# Patient Record
Sex: Female | Born: 2000 | Race: Black or African American | Hispanic: No | Marital: Single | Smoking: Never smoker
Health system: Southern US, Community
[De-identification: ages and names within clinical notes are randomized; demographics above are authoritative.]

## PROBLEM LIST (undated history)

## (undated) DIAGNOSIS — J302 Other seasonal allergic rhinitis: Secondary | ICD-10-CM

## (undated) DIAGNOSIS — J45909 Unspecified asthma, uncomplicated: Secondary | ICD-10-CM

## (undated) HISTORY — PX: WISDOM TOOTH EXTRACTION: SHX21

## (undated) HISTORY — PX: ESOPHAGOGASTRODUODENOSCOPY: SHX1529

## (undated) HISTORY — DX: Unspecified asthma, uncomplicated: J45.909

## (undated) HISTORY — DX: Other seasonal allergic rhinitis: J30.2

---
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
## 2018-02-21 ENCOUNTER — Encounter (INDEPENDENT_AMBULATORY_CARE_PROVIDER_SITE_OTHER): Payer: Self-pay

## 2018-02-21 ENCOUNTER — Ambulatory Visit (INDEPENDENT_AMBULATORY_CARE_PROVIDER_SITE_OTHER): Payer: BLUE CROSS/BLUE SHIELD | Admitting: Gastroenterology

## 2018-02-21 ENCOUNTER — Encounter

## 2018-02-21 ENCOUNTER — Encounter: Payer: Self-pay | Admitting: Gastroenterology

## 2018-02-21 VITALS — BP 100/60 | HR 76 | Ht 63.25 in | Wt 132.0 lb

## 2018-02-21 DIAGNOSIS — R110 Nausea: Secondary | ICD-10-CM

## 2018-02-21 NOTE — Patient Instructions (Signed)
If you are age 19 or older, your body mass index should be between 23-30. Your Body mass index is 23.2 kg/m. If this is out of the aforementioned range listed, please consider follow up with your Primary Care Provider.  If you are age 19 or younger, your body mass index should be between 19-25. Your Body mass index is 23.2 kg/m. If this is out of the aformentioned range listed, please consider follow up with your Primary Care Provider.   You have been scheduled for an endoscopy. Please follow written instructions given to you at your visit today. If you use inhalers (even only as needed), please bring them with you on the day of your procedure. Your physician has requested that you go to www.startemmi.com and enter the access code given to you at your visit today. This web site gives a general overview about your procedure. However, you should still follow specific instructions given to you by our office regarding your preparation for the procedure.  It was a pleasure to meet you today!  Dr. Myrtie Neitheranis

## 2018-02-21 NOTE — Progress Notes (Signed)
[facility] Consult Note:  History: Amanda Hamilton 02/21/2018  Referring physician: Self-referred  Reason for consult/chief complaint: Nausea (x 6 month, when eating and before and after having a BM, taking occasional Pepto)   Subjective  HPI:  This is a very pleasant 17 year old woman accompanied by her mother, self-referred for at least 6 months of frequent nausea.  It initially seemed to be occurring overnight and early morning, now can happen almost any time.  There may be days where she is well, but she feels nausea more days than not.  It might happen after eating or after bowel movement.  Zofran helps quite a lot and works quickly, but she is concerned that she needs to take it.  Her bowel habits are regular without rectal bleeding.  She denies vomiting, dysphagia, odynophagia or weight loss. There was a severe episode sometime in the last few months while in school in [place], and she went to emergency department.  She and her mother recall that labs and ultrasound were normal.  Her mother had given her a trial of her Zantac, which did not improve the nausea.   ROS:  Review of Systems  Constitutional: Negative for appetite change and unexpected weight change.  HENT: Negative for mouth sores and voice change.   Eyes: Negative for pain and redness.  Respiratory: Negative for cough and shortness of breath.   Cardiovascular: Negative for chest pain and palpitations.  Genitourinary: Negative for dysuria and hematuria.  Musculoskeletal: Negative for arthralgias and myalgias.  Skin: Negative for pallor and rash.  Neurological: Negative for weakness and headaches.  Hematological: Negative for adenopathy.     Past Medical History: Past Medical History:  Diagnosis Date  . Asthma      Past Surgical History: Past Surgical History:  Procedure Laterality Date  . WISDOM TOOTH EXTRACTION       Family History: Family History  Problem Relation Age of Onset    . Ulcerative colitis Mother   . Diabetes Mother   . Other Mother        H pylori, small bowel polyp  . Hypertension Father   . Alopecia Brother   . Cancer Maternal Grandmother        cholangiocarcinoma  . Diabetes Maternal Grandmother   . Breast cancer Maternal Aunt     Social History: Social History   Socioeconomic History  . Marital status: Single    Spouse name: Not on file  . Number of children: 0  . Years of education: Not on file  . Highest education level: Not on file  Occupational History  . Occupation: Consulting civil engineer  Social Needs  . Financial resource strain: Not on file  . Food insecurity:    Worry: Not on file    Inability: Not on file  . Transportation needs:    Medical: Not on file    Non-medical: Not on file  Tobacco Use  . Smoking status: Never Smoker  . Smokeless tobacco: Never Used  Substance and Sexual Activity  . Alcohol use: Yes    Comment: occasional  . Drug use: Never  . Sexual activity: Not on file  Lifestyle  . Physical activity:    Days per week: Not on file    Minutes per session: Not on file  . Stress: Not on file  Relationships  . Social connections:    Talks on phone: Not on file    Gets together: Not on file    Attends religious service: Not on file  Active member of club or organization: Not on file    Attends meetings of clubs or organizations: Not on file    Relationship status: Not on file  Other Topics Concern  . Not on file  Social History Narrative  . Not on file    Allergies: Allergies  Allergen Reactions  . Bactrim [Sulfamethoxazole-Trimethoprim] Nausea And Vomiting    Outpatient Meds: No current outpatient medications on file.   No current facility-administered medications for this visit.       ___________________________________________________________________ Objective   Exam:  BP 100/60 (BP Location: Left Arm, Patient Position: Sitting, Cuff Size: Normal)   Pulse 76   Ht 5' 3.25" (1.607 m) Comment:  height measured without shoes  Wt 132 lb (59.9 kg)   LMP 01/28/2018   BMI 23.20 kg/m    General: this is a(n) well-appearing patient  Eyes: sclera anicteric, no redness  ENT: oral mucosa moist without lesions, no cervical or supraclavicular lymphadenopathy, good dentition  CV: RRR without murmur, S1/S2, no JVD, no peripheral edema  Resp: clear to auscultation bilaterally, normal RR and effort noted  GI: soft, mild epigastric tenderness, with active bowel sounds. No guarding or palpable organomegaly noted.  Skin; warm and dry, no rash or jaundice noted  Neuro: awake, alert and oriented x 3. Normal gross motor function and fluent speech  Labs:  No data for review  Assessment: Encounter Diagnosis  Name Primary?  . Nausea without vomiting Yes    Nausea without associated symptoms, some triggers that are not always consistent.  She denies vomiting or weight loss.  Her mother was positive for H. pylori and recalls having similar symptoms. Emelda FearFarin does not take aspirin or NSAIDs regularly (only briefly during menstrual cycle), and is otherwise healthy. Possible H. pylori gastritis, less likely peptic ulcer, very unlikely neoplasia.  Possible gastric dysrhythmia.  It does not sound typical for biliary colic, and ultrasound was reportedly normal.  Plan:  Upper endoscopy.  She is agreeable after discussion of the procedure and risks.  Thank you for the courtesy of this consult.  Please call me with any questions or concerns.  Charlie PitterHenry L Danis III  CC: Patient, No Pcp Per

## 2018-02-22 ENCOUNTER — Other Ambulatory Visit: Payer: Self-pay

## 2018-02-22 ENCOUNTER — Ambulatory Visit (AMBULATORY_SURGERY_CENTER): Payer: BLUE CROSS/BLUE SHIELD | Admitting: Gastroenterology

## 2018-02-22 ENCOUNTER — Encounter: Payer: Self-pay | Admitting: Gastroenterology

## 2018-02-22 VITALS — BP 100/70 | HR 79 | Temp 98.9°F | Resp 22 | Ht 63.25 in | Wt 132.0 lb

## 2018-02-22 DIAGNOSIS — R110 Nausea: Secondary | ICD-10-CM | POA: Diagnosis not present

## 2018-02-22 DIAGNOSIS — K3189 Other diseases of stomach and duodenum: Secondary | ICD-10-CM | POA: Diagnosis not present

## 2018-02-22 MED ORDER — SODIUM CHLORIDE 0.9 % IV SOLN: 500.0000 mL | Freq: Once | INTRAVENOUS | Status: DC

## 2018-02-22 NOTE — Patient Instructions (Signed)
YOU HAD AN ENDOSCOPIC PROCEDURE TODAY AT THE [facility]:   Refer to the procedure report that was given to you for any specific questions about what was found during the examination.  If the procedure report does not answer your questions, please call your gastroenterologist to clarify.  If you requested that your care partner not be given the details of your procedure findings, then the procedure report has been included in a sealed envelope for you to review at your convenience later.  YOU SHOULD EXPECT: Some feelings of bloating in the abdomen. Passage of more gas than usual.  Walking can help get rid of the air that was put into your GI tract during the procedure and reduce the bloating.   Please Note:  You might notice some irritation and congestion in your nose or some drainage.  This is from the oxygen used during your procedure.  There is no need for concern and it should clear up in a day or so.  SYMPTOMS TO REPORT IMMEDIATELY:    Following upper endoscopy (EGD)  Vomiting of blood or coffee ground material  New chest pain or pain under the shoulder blades  Painful or persistently difficult swallowing  New shortness of breath  Fever of 100F or higher  Black, tarry-looking stools  For urgent or emergent issues, a gastroenterologist can be reached at any hour by calling [phone].   DIET:  We do recommend a small meal at first, but then you may proceed to your regular diet.  Drink plenty of fluids but you should avoid alcoholic beverages for 24 hours.  ACTIVITY:  You should plan to take it easy for the rest of today and you should NOT DRIVE or use heavy machinery until tomorrow (because of the sedation medicines used during the test).    FOLLOW UP: Our staff will call the number listed on your records the next business day following your procedure to check on you and address any questions or concerns that you may have regarding the information given to you  following your procedure. If we do not reach you, we will leave a message.  However, if you are feeling well and you are not experiencing any problems, there is no need to return our call.  We will assume that you have returned to your regular daily activities without incident.  If any biopsies were taken you will be contacted by phone or by letter within the next 1-3 weeks.  Please call us at [phone] if you have not heard about the biopsies in 3 weeks.    SIGNATURES/CONFIDENTIALITY: You and/or your care partner have signed paperwork which will be entered into your electronic medical record.  These signatures attest to the fact that that the information above on your After Visit Summary has been reviewed and is understood.  Full responsibility of the confidentiality of this discharge information lies with you and/or your care-partner.  Read all handouts given to you by  Your recovery room nurse.

## 2018-02-22 NOTE — Progress Notes (Signed)
Report given to PACU, vss 

## 2018-02-22 NOTE — Progress Notes (Signed)
Pt. Reports no change in her medical or surgical history since her pre-visit 02/21/2018. 

## 2018-02-22 NOTE — Op Note (Signed)
[facility] Patient Name: Amanda Hamilton Procedure Date: 02/22/2018 10:21 AM MRN: [id number] Endoscopist: Sherilyn Cooter L. Myrtie Neither , MD Age: 17 Referring MD:  Date of Birth: [birth year] Gender: Female Account #: [id number] Procedure:                Upper GI endoscopy Indications:              Nausea Medicines:                Monitored Anesthesia Care Procedure:                Pre-Anesthesia Assessment:                           - Prior to the procedure, a History and Physical                            was performed, and patient medications and                            allergies were reviewed. The patient's tolerance of                            previous anesthesia was also reviewed. The risks                            and benefits of the procedure and the sedation                            options and risks were discussed with the patient.                            All questions were answered, and informed consent                            was obtained. Prior Anticoagulants: The patient has                            taken no previous anticoagulant or antiplatelet                            agents. ASA Grade Assessment: I - A normal, healthy                            patient. After reviewing the risks and benefits,                            the patient was deemed in satisfactory condition to                            undergo the procedure.                           After obtaining informed consent, the endoscope was  passed under direct vision. Throughout the                            procedure, the patient's blood pressure, pulse, and                            oxygen saturations were monitored continuously. The                            Endoscope was introduced through the mouth, and                            advanced to the second part of duodenum. The upper                            GI endoscopy was accomplished without difficulty.                 The patient tolerated the procedure well. Scope In: Scope Out: Findings:                 The esophagus was normal.                           The entire examined stomach was normal. Biopsies                            were taken with a cold forceps for Helicobacter                            pylori testing using CLOtest.                           The cardia and gastric fundus were normal on                            retroflexion.                           The examined duodenum was normal. Biopsies for                            histology were taken with a cold forceps for                            evaluation of celiac disease. Complications:            No immediate complications. Estimated Blood Loss:     Estimated blood loss was minimal. Impression:               - Normal esophagus.                           - Normal stomach. Biopsied.                           - Normal examined duodenum. Biopsied. Recommendation:           -  Patient has a contact number available for                            emergencies. The signs and symptoms of potential                            delayed complications were discussed with the                            patient. Return to normal activities tomorrow.                            Written discharge instructions were provided to the                            patient.                           - Resume previous diet.                           - Continue present medications.                           - Await pathology results. Henry L. Myrtie Neitheranis, MD 02/22/2018 10:34:19 AM This report has been signed electronically.

## 2018-02-22 NOTE — Progress Notes (Signed)
Called to room to assist during endoscopic procedure.  Patient ID and intended procedure confirmed with present staff. Received instructions for my participation in the procedure from the performing physician.  

## 2018-02-23 ENCOUNTER — Telehealth: Payer: Self-pay

## 2018-02-23 LAB — HELICOBACTER PYLORI SCREEN-BIOPSY: UREASE: NEGATIVE

## 2018-02-23 NOTE — Telephone Encounter (Signed)
  Follow up Call-  Call back number 02/22/2018  Post procedure Call Back phone  # [phone]  Permission to leave phone message Yes     Patient questions:  Do you have a fever, pain , or abdominal swelling? No. Pain Score  0 *  Have you tolerated food without any problems? Yes.    Have you been able to return to your normal activities? Yes.    Do you have any questions about your discharge instructions: Diet   No. Medications  No. Follow up visit  No.  Do you have questions or concerns about your Care? No.  Actions: * If pain score is 4 or above: No action needed, pain <4.

## 2018-03-05 ENCOUNTER — Telehealth: Payer: Self-pay | Admitting: Gastroenterology

## 2018-03-09 DIAGNOSIS — H0014 Chalazion left upper eyelid: Secondary | ICD-10-CM | POA: Diagnosis not present

## 2018-03-19 DIAGNOSIS — H0014 Chalazion left upper eyelid: Secondary | ICD-10-CM | POA: Diagnosis not present

## 2018-03-22 ENCOUNTER — Telehealth: Payer: Self-pay | Admitting: Gastroenterology

## 2018-03-22 NOTE — Telephone Encounter (Signed)
Spoke with her mother after the EGD.  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   Suspect there may be some school-related anxiety aggravating it as well, which is common.  Since she has to return to school soon, let's try her on a different med for a couple weeks and see if any better than zofran to decrease the frequency of symptoms.  If not better, then she can take the zofran.  I can see her next school break with enough notice.  Rx:  Levbid 0.375 mg.  One tablet once daily.  Disp # 14, Rf 1

## 2018-03-22 NOTE — Telephone Encounter (Signed)
Spoke to patient, she is still having issues with nausea. She has noticed it happening either prior to or after a bowel movement. She states the zofran helps. She has tried adjusting her diet, which has not seemed to make a difference. She will be heading back to school soon.

## 2018-03-23 ENCOUNTER — Other Ambulatory Visit: Payer: Self-pay

## 2018-03-23 MED ORDER — HYOSCYAMINE SULFATE ER 0.375 MG PO TB12: 0.3750 mg | ORAL_TABLET | Freq: Every day | ORAL | 1 refills | Status: DC

## 2018-03-23 NOTE — Telephone Encounter (Signed)
Spoke to patient she will try the Levbid daily, she will contact our office to let us know if it is working or not. She returns to school on 03/26/18. When she knows her schedule advised her to contact office 6-8 weeks earlier than her break to get a follow up visit. Rx sent in. She understands that if it is not helping may continue the zofran.

## 2018-03-30 ENCOUNTER — Telehealth: Payer: Self-pay | Admitting: Gastroenterology

## 2018-03-30 NOTE — Telephone Encounter (Signed)
Tried to call patient back, unable to lvm. Wondering if she also tried the zofran. Will have to try back later.

## 2018-03-30 NOTE — Telephone Encounter (Signed)
Pt still dealing with nausea, med prescribed not working, needs advise on what else she can do. Ok to leave detailed message if she cannot answer.

## 2018-04-02 ENCOUNTER — Telehealth: Payer: Self-pay

## 2018-04-02 NOTE — Telephone Encounter (Signed)
Routed to DOD, patient of Dr. Myrtie Neitheranis, she states that hyoscyamine and zofran are not helping with the nausea. Episodes of nause are waking her up in the middle of the night and getting more frequent throughout the day. Please advise.

## 2018-04-02 NOTE — Telephone Encounter (Signed)
Patient advised to try the FDgard along with her other medications. She also wanted to let Dr. Myrtie Neitheranis know that occasionally when the nausea starts she will "feel shaky". Advised her to stay hydrated and may want to try some Gatorade along with the water she normally drinks. Patient is almost out of Zofran Rx and will need refill. Let her know that I would send this onto Dr. Myrtie Neitheranis and he can address that issue. She does have enough for a couple more days.

## 2018-04-02 NOTE — Telephone Encounter (Signed)
Chart reviewed, patient of Dr. Myrtie Neitheranis.  He is off today.  Ongoing nausea.  EGD neg.  US neg. 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  FDgard was previously recommended and I do not know if she tried this. If not, I recommend she try it. OTC and use per box instruction. Zofran 8 mg q 8 hours is max dose. Could try promethazine, but this is sedating and would likely interfere with school, driving, etc.  Promotility agent also considered, but often side-effects.  Likely would need GES before metoclopramide.  For today, would try FDgard, continue with current therapy and await further recs from Dr. Myrtie Neitheranis tomorrow.

## 2018-04-03 ENCOUNTER — Telehealth: Payer: Self-pay | Admitting: Gastroenterology

## 2018-04-03 ENCOUNTER — Other Ambulatory Visit: Payer: Self-pay

## 2018-04-03 DIAGNOSIS — R110 Nausea: Secondary | ICD-10-CM

## 2018-04-03 MED ORDER — ONDANSETRON HCL 8 MG PO TABS: 8.0000 mg | ORAL_TABLET | Freq: Three times a day (TID) | ORAL | 2 refills | Status: DC | PRN

## 2018-04-03 NOTE — Telephone Encounter (Signed)
One more thought.  Please also schedule her for a non-contrast head CT scan to rule out a central cause of nausea, especially because she now describes it often occurring in the morning and overnight.  - HD

## 2018-04-03 NOTE — Telephone Encounter (Signed)
zofran refilled Please schedule gastric emptying study. OV to follow up

## 2018-04-03 NOTE — Addendum Note (Signed)
Addended by: Charlie PitterANIS, HENRY L on: 04/03/2018 01:04 PM   Modules accepted: Orders

## 2018-04-03 NOTE — Telephone Encounter (Signed)
She is currently at school. I have sent her a message asking her to call us to let us know when she will have a break in order to schedule these tests/appointments.

## 2018-04-04 NOTE — Telephone Encounter (Signed)
Left message for patient's mother to give me a call and I will explain what tests Dr. Myrtie Neitheranis is recommending.

## 2018-04-05 ENCOUNTER — Telehealth: Payer: Self-pay | Admitting: Gastroenterology

## 2018-04-05 NOTE — Telephone Encounter (Signed)
Spoke to patient's mother, explained the tests that were ordered. Patient was able to see in MyChart the dates/times and she had to rescheduled them due to her classes. Mother is with daughter now and will be throughout the weekend and understands if need be will take her to the nearest ED. Explained that the other anti-nausea medication can be very sedating. The patient rescheduled her tests to 8/30, instructed to keep her follow up appointment with Dr. Myrtie Neitheranis.

## 2018-04-09 ENCOUNTER — Other Ambulatory Visit (HOSPITAL_COMMUNITY): Payer: BLUE CROSS/BLUE SHIELD

## 2018-04-09 ENCOUNTER — Ambulatory Visit (HOSPITAL_COMMUNITY): Payer: BLUE CROSS/BLUE SHIELD

## 2018-04-13 ENCOUNTER — Ambulatory Visit (HOSPITAL_COMMUNITY)
Admission: RE | Admit: 2018-04-13 | Discharge: 2018-04-13 | Disposition: A | Discharge status: Home / Self Care | Payer: BLUE CROSS/BLUE SHIELD | Source: Ambulatory Visit | Attending: Gastroenterology | Admitting: Gastroenterology

## 2018-04-13 DIAGNOSIS — R110 Nausea: Secondary | ICD-10-CM | POA: Insufficient documentation

## 2018-04-13 DIAGNOSIS — G9389 Other specified disorders of brain: Secondary | ICD-10-CM | POA: Diagnosis not present

## 2018-04-13 MED ORDER — TECHNETIUM TC 99M SULFUR COLLOID
2.1200 | Freq: Once | INTRAVENOUS | Status: AC | PRN
  Administered 2018-04-13: 2.12 via ORAL

## 2018-04-13 MED ORDER — TECHNETIUM TC 99M SULFUR COLLOID: 2.1200 | Freq: Once | INTRAVENOUS | Status: DC | PRN

## 2018-04-20 ENCOUNTER — Telehealth: Payer: Self-pay | Admitting: Gastroenterology

## 2018-04-20 NOTE — Telephone Encounter (Signed)
Spoke to patient she wanted to know if she needed to follow up, per Dr. Myrtie Neither' note he would like to see her in office to follow up and review tests/reassess.

## 2018-05-04 ENCOUNTER — Ambulatory Visit: Payer: BLUE CROSS/BLUE SHIELD | Admitting: Gastroenterology

## 2018-05-18 ENCOUNTER — Telehealth: Payer: Self-pay | Admitting: Gastroenterology

## 2018-05-18 MED ORDER — ONDANSETRON HCL 8 MG PO TABS: 8.0000 mg | ORAL_TABLET | Freq: Three times a day (TID) | ORAL | 2 refills | Status: DC | PRN

## 2018-05-18 NOTE — Telephone Encounter (Signed)
Refill request for Zofran 8mg  1 every 8 hrs as needed.  Last seen 7-10 2019

## 2018-05-18 NOTE — Telephone Encounter (Signed)
Patient requesting refill of medication zofran sent to [facility] in [place].

## 2018-06-11 ENCOUNTER — Encounter: Payer: Self-pay | Admitting: Gastroenterology

## 2018-06-11 ENCOUNTER — Ambulatory Visit: Payer: BLUE CROSS/BLUE SHIELD | Admitting: Gastroenterology

## 2018-06-11 VITALS — BP 98/64 | HR 70 | Ht 63.0 in | Wt 128.5 lb

## 2018-06-11 DIAGNOSIS — R110 Nausea: Secondary | ICD-10-CM

## 2018-06-11 DIAGNOSIS — K5901 Slow transit constipation: Secondary | ICD-10-CM

## 2018-06-11 DIAGNOSIS — R6881 Early satiety: Secondary | ICD-10-CM

## 2018-06-11 MED ORDER — DICYCLOMINE HCL 10 MG PO CAPS: 10.0000 mg | ORAL_CAPSULE | Freq: Three times a day (TID) | ORAL | 1 refills | Status: DC

## 2018-06-11 NOTE — Patient Instructions (Signed)
Stop taking FD guard  Decrease Zofran use to as needed for nausea, rather than every 8 hours  Start dicyclomine 10 mg twice a day before breakfast and supper.  If this causes worsened constipation or side effects such as blurred vision, dry mouth, fatigue, stop this medicine.  Start MiraLAX powder, one half capful in a glass of liquid every other day to relieve constipation.  Stop if stools become too loose or frequent.

## 2018-06-11 NOTE — Progress Notes (Signed)
     [facility] Progress Note  Chief Complaint: Nausea  Subjective  History:  Amanda Hamilton follows up for her nausea and constipation. Symptoms are much the same as before, with frequent nausea but no vomiting.  It previously seem to happen mostly in the morning, but now can be about any time.  She also tends toward constipation. Nausea typically responds to Zofran, but she has been uneasy about taking that regularly. Upper endoscopy with biopsies normal, subsequent normal gastric emptying study and CT scan of head  She is taking Zofran 8 mg every 8 hours, and this seems to have led to constipation, with a BM about twice a week, which is as often as she feels she needs to have one.  She has feelings of early satiety and nausea several times a week. She did not tolerate Levbid well, stopping it due to fatigue, dry mouth  ROS: Cardiovascular:  no chest pain Respiratory: no dyspnea  The patient's Past Medical, Family and Social History were reviewed and are on file in the EMR.  Objective:  Med list reviewed  Current Outpatient Medications:  .  NON FORMULARY, daily. FD Gard 2 tabs before and after meals, Disp: , Rfl:  .  ondansetron (ZOFRAN) 8 MG tablet, Take 1 tablet (8 mg total) by mouth every 8 (eight) hours as needed. for nausea, Disp: 30 tablet, Rfl: 2 .  dicyclomine (BENTYL) 10 MG capsule, Take 1 capsule (10 mg total) by mouth 3 (three) times daily before meals., Disp: 60 capsule, Rfl: 1   Vital signs in last 24 hrs: Vitals:   06/11/18 0832  BP: 98/64  Pulse: 70  Weight down 4 pounds since July visit  Physical Exam  Her mother is present for the entire encounter. She is well-appearing, good muscle mass, alert, conversational and pleasant  HEENT: sclera anicteric, oral mucosa moist without lesions  Neck: supple, no thyromegaly, JVD or lymphadenopathy  Cardiac: RRR without murmurs, S1S2 heard, no peripheral edema  Pulm: clear to auscultation bilaterally, normal RR  and effort noted  Abdomen: soft, mild epigastric tenderness, with active bowel sounds. No guarding or palpable hepatosplenomegaly.  Skin; warm and dry, no jaundice or rash  Data:  Testing results as noted above   @ASSESSMENTPLANBEGIN @ Assessment: Encounter Diagnoses  Name Primary?  . Nausea without vomiting Yes  . Early satiety   . 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  Her constipation seems largely related to side effect of Zofran.  Plan: Trial of dicyclomine 10 mg 3 times daily hopefully better tolerated and Levbid. Stop FD gard, as it does not seem to been helping much. Decrease frequency of Zofran to just as needed with nausea which hopefully will help with the constipation side effect.  MiraLAX one half capful in a glass of liquid every other day to relieve constipation.  Stop if stools become too loose or frequent.  If this plan is not working out, only other treatment that I think might be helpful would be a trial of low-dose nocturnal buspirone, which has been studied and nonulcer dyspepsia.  We had an initial discussion about this, just that she knows the type of medicine and its indications.  Total time 25 minutes, over half spent face-to-face with patient in counseling and coordination of care.   Charlie Pitter III

## 2018-07-06 ENCOUNTER — Telehealth: Payer: Self-pay | Admitting: Gastroenterology

## 2018-07-06 NOTE — Telephone Encounter (Signed)
Left message I was returning her call. 

## 2018-07-06 NOTE — Telephone Encounter (Signed)
Patient has been taking Bentyl 2-3 times daily it does not seem to be helping, she is also having to take Zofran prn as she continues to have nausea. Please advise.

## 2018-07-09 NOTE — Telephone Encounter (Signed)
Attempted to call pt but receive message that mailbox is full and cannot accept messages. Will try again later.

## 2018-07-09 NOTE — Telephone Encounter (Signed)
Stop taking the dicyclomine.  We discussed at the recent visit that if this did not work out well, the last medicine that I know of which might be helpful is buspirone.  Buspirone 15 mg tablet Start with one half tablet at bedtime for a week.  If tolerated, and if no improvement in symptoms, increase to a whole tablet. Dispense 25 tablets, one refill.  Can lead to daytime fatigue and vivid dreams.

## 2018-07-10 NOTE — Telephone Encounter (Signed)
Left message for pt to call back on home number.

## 2018-07-16 ENCOUNTER — Telehealth: Payer: Self-pay | Admitting: Gastroenterology

## 2018-07-16 MED ORDER — BUSPIRONE HCL 15 MG PO TABS: ORAL_TABLET | ORAL | 1 refills | Status: DC

## 2018-07-16 NOTE — Telephone Encounter (Signed)
See phone note from 07/06/18.  Patient and mother notified of new recommendations from Dr. Myrtie Neitheranis.  Rx sent to [facility] in [place], she is in school there.

## 2018-07-19 ENCOUNTER — Telehealth: Payer: Self-pay | Admitting: Gastroenterology

## 2018-07-20 NOTE — Telephone Encounter (Signed)
Noted  

## 2018-07-20 NOTE — Telephone Encounter (Signed)
Pt aware.

## 2018-07-20 NOTE — Telephone Encounter (Signed)
Thanks for letting me know.  It will have to be zofran for her, since we discussed how the buspirone was the last of the meds I had to offer for this.

## 2018-07-20 NOTE — Telephone Encounter (Signed)
Pt called and wanted to let Dr. Myrtie Neitheranis know she took 1/2 tab of the Buspar and she felt nauseated and felt like her heart was racing. She did not like the way it may her feel and had not taken it anymore. Dr. Myrtie Neitheranis aware.

## 2018-07-21 ENCOUNTER — Other Ambulatory Visit: Payer: Self-pay | Admitting: Gastroenterology

## 2018-07-27 DIAGNOSIS — J3081 Allergic rhinitis due to animal (cat) (dog) hair and dander: Secondary | ICD-10-CM | POA: Diagnosis not present

## 2018-07-27 DIAGNOSIS — J3089 Other allergic rhinitis: Secondary | ICD-10-CM | POA: Diagnosis not present

## 2018-07-27 DIAGNOSIS — J301 Allergic rhinitis due to pollen: Secondary | ICD-10-CM | POA: Diagnosis not present

## 2018-07-27 DIAGNOSIS — T781XXD Other adverse food reactions, not elsewhere classified, subsequent encounter: Secondary | ICD-10-CM | POA: Diagnosis not present

## 2018-08-01 ENCOUNTER — Telehealth: Payer: Self-pay | Admitting: Gastroenterology

## 2018-08-01 DIAGNOSIS — Z6822 Body mass index (BMI) 22.0-22.9, adult: Secondary | ICD-10-CM | POA: Diagnosis not present

## 2018-08-01 DIAGNOSIS — Z01419 Encounter for gynecological examination (general) (routine) without abnormal findings: Secondary | ICD-10-CM | POA: Diagnosis not present

## 2018-08-01 NOTE — Telephone Encounter (Signed)
Pt called back in about the medication problem today she stated she would like to talk with a nurse.

## 2018-08-01 NOTE — Telephone Encounter (Signed)
Ms. Amanda Hamilton did not like the Buspar, due to the side effects she experienced. Today she saw her OBGYN provider Mitchel HonourMegan Morris at [facility] [place]. She recommended fluoxetine in the replacement of Buspar. Patient just wanted to see what your thought were on this medication and if you think it could help. Please advise.

## 2018-08-02 NOTE — Telephone Encounter (Signed)
Although buspar is a medication used for anxiety, that is not how I was using it here.  It also appears to modulate pain receptors in the intestine.  Fluoxetine is a medicine for depression and anxiety that works by a different mechanism.  If her gynecologist thinks mood issues are present and would like to prescribe fluoxetine, then Amanda Hamilton should consider trying it.  It might help the nausea, but I can't say I have any experience using fluoxetine for that.

## 2018-08-03 NOTE — Telephone Encounter (Signed)
I understand that they are frustrated since extensive testing and treatment have not been successful.  While they are welcome to come to a clinic visit here, I truly do not think we have anything further to offer her.  I recommend consultation at an academic medical center.  If they are agreeable, I would advise a referral to [facility].  All records/notes/results should accompany such a referral..

## 2018-08-03 NOTE — Telephone Encounter (Signed)
Spoke to the patient and her mom on speaker phone. They state understanding. Due to not tolerating the Buspar. A follow up with PA Zehr on 08-14-2018 tt

## 2018-08-07 ENCOUNTER — Other Ambulatory Visit: Payer: Self-pay | Admitting: Gastroenterology

## 2018-08-14 ENCOUNTER — Ambulatory Visit: Payer: BLUE CROSS/BLUE SHIELD | Admitting: Gastroenterology

## 2018-08-14 ENCOUNTER — Encounter: Payer: Self-pay | Admitting: Gastroenterology

## 2018-08-14 ENCOUNTER — Other Ambulatory Visit (INDEPENDENT_AMBULATORY_CARE_PROVIDER_SITE_OTHER): Payer: BLUE CROSS/BLUE SHIELD

## 2018-08-14 VITALS — BP 100/70 | HR 72 | Ht 63.0 in | Wt 130.0 lb

## 2018-08-14 DIAGNOSIS — R110 Nausea: Secondary | ICD-10-CM

## 2018-08-14 LAB — TSH: TSH: 0.59 u[IU]/mL (ref 0.40–5.00)

## 2018-08-14 MED ORDER — FAMOTIDINE 40 MG PO TABS: 40.0000 mg | ORAL_TABLET | Freq: Every day | ORAL | 5 refills | Status: DC

## 2018-08-14 NOTE — Progress Notes (Signed)
Thank you for sending this case to me. I have reviewed the entire note, and the outlined plan seems appropriate.  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 [facility] referral reasonable.  Amada JupiterHenry Danis, MD

## 2018-08-14 NOTE — Progress Notes (Signed)
08/14/2018 Jacklynn BarnacleFarin Harrington [id number] 06/22/2001   HISTORY OF PRESENT ILLNESS:  This is a 17 year old female who is a patient of Dr. Irving Burtonanis's with complaints of chronic nausea, no vomiting for the past 10 months.  Began in March 2019 and never had any similar symptoms previously.  Occurs almost on a daily basis.  Has had negative EGD, GES, and CT of the head.  Has tried FD guard, buspirone, Levbid, Bentyl all with either no improvement or experienced side effects.  Currently using Zofran every 8 hours somewhat regularly.  She saw her GYN and they suggested Prozac, but Dr. Myrtie Neitheranis said that he was not aware of any significant evidence that it would help her nausea although may treat underlying anxiety which in turn could help her nausea if they are related.  She opted to not try that medication.  Says she saw an allergist and was found to have no food allergies.  She denies any abdominal pain.  Her only other complaint is occasional abdominal bloating.  She was here today with her mother.    Past Medical History:  Diagnosis Date  . Asthma    Past Surgical History:  Procedure Laterality Date  . ESOPHAGOGASTRODUODENOSCOPY    . WISDOM TOOTH EXTRACTION      reports that she has never smoked. She has never used smokeless tobacco. She reports current alcohol use. She reports that she does not use drugs. family history includes Alopecia in her brother; Breast cancer in her maternal aunt; Cancer in her maternal grandmother; Diabetes in her maternal grandmother and mother; Hypertension in her father; Other in her mother; Ulcerative colitis in her mother. Allergies  Allergen Reactions  . Bactrim [Sulfamethoxazole-Trimethoprim] Nausea And Vomiting  . Buspar [Buspirone] Other (See Comments)    Felt like heart "jumping out of her", nausea, felt like a weight on her      Outpatient Encounter Medications as of 08/14/2018  Medication Sig  . famotidine (PEPCID) 40 MG tablet Take 1 tablet (40 mg total)  by mouth daily.  . ondansetron (ZOFRAN) 8 MG tablet TAKE 1 TABLET BY MOUTH EVERY 8 HOURS AS NEEDED FOR NAUSEA  . [DISCONTINUED] busPIRone (BUSPAR) 15 MG tablet Take 1/2 tablet q HS for 1 week. May increase to 1 tablet q HS  . [DISCONTINUED] NON FORMULARY daily. FD Gard 2 tabs before and after meals   No facility-administered encounter medications on file as of 08/14/2018.      REVIEW OF SYSTEMS  : All other systems reviewed and negative except where noted in the History of Present Illness.   PHYSICAL EXAM: BP 100/70   Pulse 72   Ht 5\' 3"  (1.6 m)   Wt 130 lb (59 kg)   BMI 23.03 kg/m  General: Well developed black female in no acute distress Head: Normocephalic and atraumatic Eyes:  Sclerae anicteric, conjunctiva pink. Ears: Normal auditory acuity Lungs: Clear throughout to auscultation; no increased WOB. Heart: Regular rate and rhythm; no M/R/G. Abdomen: Soft, non-distended.  BS present.  Non-tender. Musculoskeletal: Symmetrical with no gross deformities  Skin: No lesions on visible extremities Extremities: No edema  Neurological: Alert oriented x 4, grossly non-focal Psychological:  Alert and cooperative. Normal mood and affect  ASSESSMENT AND PLAN: *17 year old female with complaints of chronic nausea, no vomiting for the past 10 months.  Occurs almost on a daily basis.  Has had negative EGD, GES, and CT of the head.  Has tried FD guard, buspirone, Levbid, Bentyl all with either no improvement  or experienced side effects.  Currently using Zofran every 8 hours somewhat regularly.  Still has symptoms despite that medication.  We had extensive conversation regarding her symptoms and other treatment regimens.  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  We are going to refer her to [facility] for further input regarding evaluation and treatment.  I considered CT scan of abdomen and pelvis, but I felt that it was probably overkill and likely to be negative with exposing her to a lot  of radiation at her age.  We will see what they think there.  I am going to put her on Pepcid 40 mg once daily in the interim just to see if this will make any additional difference although EGD was completely normal.  She will continue her Zofran as she is currently taking it.  She also requested a referral to nutritionist so I put in for that as well.   CC:  No ref. provider found

## 2018-08-14 NOTE — Patient Instructions (Addendum)
We have sent the following medications to your pharmacy for you to pick up at your convenience: Pepcid  Your provider has requested that you go to the basement level for lab work before leaving today. Press "B" on the elevator. The lab is located at the first door on the left as you exit the elevator. TSH  You have been referred to a Nutritionist.  We are referring you to [facility] for chronic nausea.  We will contact you with an appointment date and time.  Thank you for choosing me and [facility].   Doug SouJessica Zehr, PA-C

## 2018-09-13 ENCOUNTER — Telehealth: Payer: Self-pay

## 2018-09-13 ENCOUNTER — Telehealth: Payer: Self-pay | Admitting: Gastroenterology

## 2018-09-13 MED ORDER — ONDANSETRON HCL 8 MG PO TABS: 8.0000 mg | ORAL_TABLET | Freq: Three times a day (TID) | ORAL | 6 refills | Status: DC | PRN

## 2018-09-13 NOTE — Telephone Encounter (Signed)
  Spoke with patient today about [facility] Referral - Appt made with Dr. Gibson Ramp  [address]. [place] for October 29, 2018 at 8:30.Patient states she is running out of Zofran.  Wants to know what can be done about shortage before next refill.

## 2018-09-13 NOTE — Telephone Encounter (Signed)
I sent a new Rx to [facility] on [address] in [place].  Jess, if you discussed [facility] referral for her, please have your clinical assistant follow up on that.  - HD

## 2018-09-13 NOTE — Telephone Encounter (Signed)
A user error has taken place: ERROR °

## 2018-09-13 NOTE — Telephone Encounter (Signed)
Pt reports that ins only covers 18 t ondansetron q 21 days, so she has lapse in taking the med but nausea continues.   Note from 12.31.19 states that Dr. Myrtie Neither recommends pt to be referred to [facility].  Pt has not heard from them for appt.  Please advise.

## 2018-09-17 NOTE — Telephone Encounter (Signed)
  Per Priscella MannGloria Morayati-   Spoke with patient today about [facility] Referral - Appt made with Dr. Gibson RampNyree Thorne  [address] [place] for October 29, 2018 at 8:30.Patient states she is running out of Zofran.  Wants to know what can be done about shortage before next refill.

## 2018-10-23 DIAGNOSIS — R110 Nausea: Secondary | ICD-10-CM | POA: Diagnosis not present

## 2019-01-24 DIAGNOSIS — R110 Nausea: Secondary | ICD-10-CM | POA: Diagnosis not present

## 2019-01-24 DIAGNOSIS — R194 Change in bowel habit: Secondary | ICD-10-CM | POA: Diagnosis not present

## 2019-02-22 ENCOUNTER — Encounter: Payer: Self-pay | Admitting: Family Medicine

## 2019-02-22 ENCOUNTER — Other Ambulatory Visit: Payer: Self-pay

## 2019-02-22 ENCOUNTER — Ambulatory Visit: Payer: BC Managed Care – PPO | Admitting: Family Medicine

## 2019-02-22 VITALS — BP 108/68 | HR 60 | Temp 97.5°F | Ht 64.0 in | Wt 136.2 lb

## 2019-02-22 DIAGNOSIS — Z0000 Encounter for general adult medical examination without abnormal findings: Secondary | ICD-10-CM | POA: Diagnosis not present

## 2019-02-22 DIAGNOSIS — J45909 Unspecified asthma, uncomplicated: Secondary | ICD-10-CM | POA: Insufficient documentation

## 2019-02-22 DIAGNOSIS — R110 Nausea: Secondary | ICD-10-CM | POA: Diagnosis not present

## 2019-02-22 DIAGNOSIS — J4520 Mild intermittent asthma, uncomplicated: Secondary | ICD-10-CM

## 2019-02-22 DIAGNOSIS — J302 Other seasonal allergic rhinitis: Secondary | ICD-10-CM | POA: Diagnosis not present

## 2019-02-22 NOTE — Progress Notes (Signed)
Subjective:    Patient ID: Amanda Hamilton, female    DOB: 10-07-2000, 18 y.o.   MRN: [id number]  HPI Chief Complaint  Patient presents with  . new pt    new pt, get established. chronic nausea but see GI   She is new to the practice and here for a complete physical exam. Previous medical care: OB/GYN and GI only  Last CPE: years ago by PCP  Other providers: Linda Hedges at [facility]  GI- Dr. Carlton Adam at [facility] she is being worked up for chronic nausea at [employer].   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 Plans to get a degree in Psychology Denies smoking, drinking alcohol, drug use  Diet: healthy diet overall  Excerise: regular exercise   Immunizations: UTD per patient.   Health maintenance:  Last Gynecological Exam: 6 months ago  Last Menstrual cycle: 02/08/2019 Pregnancies: 0 Denies ever being sexually active.  Last Dental Exam: 6 months ago  Last Eye Exam: scheduled for next week   Wears seatbelt always, smoke detectors in home and functioning, does not text while driving and feels safe in home environment.   Reviewed allergies, medications, past medical, surgical, family, and social history.   Review of Systems Pertinent positives and negatives in the history of present illness.     Objective:   Physical Exam BP 108/68   Pulse 60   Temp (!) 97.5 F (36.4 C) (Tympanic)   Ht 5\' 4"  (1.626 m)   Wt 136 lb 3.2 oz (61.8 kg)   LMP 02/08/2019   BMI 23.38 kg/m   General Appearance:    Alert, cooperative, no distress, appears stated age  Head:    Normocephalic, without obvious abnormality, atraumatic  Eyes:    PERRL, conjunctiva/corneas clear, EOM's intact, fundi    benign  Ears:    Normal TM's and external ear canals  Nose:  Mask in place   Throat:  Mask in place   Neck:    Supple, no lymphadenopathy;  thyroid:  no   enlargement/tenderness/nodules; no carotid   bruit or JVD  Back:    Spine nontender, no curvature, ROM normal, no CVA     tenderness  Lungs:     Clear to auscultation bilaterally without wheezes, rales or     ronchi; respirations unlabored  Chest Wall:    No tenderness or deformity   Heart:    Regular rate and rhythm, S1 and S2 normal, no murmur, rub   or gallop  Breast Exam:    OB/GYN  Abdomen:     Soft, non-tender, nondistended, normoactive bowel sounds,    no masses, no hepatosplenomegaly  Genitalia:    OB/GYN  Rectal:    Not performed due to age<40 and no related complaints  Extremities:   No clubbing, cyanosis or edema  Pulses:   2+ and symmetric all extremities  Skin:   Skin color, texture, turgor normal, no rashes or lesions  Lymph nodes:   Cervical, supraclavicular, and axillary nodes normal  Neurologic:   CNII-XII intact, normal strength, sensation and gait; reflexes 2+ and symmetric throughout          Psych:   Normal Hamilton, affect, hygiene and grooming.         Assessment & Plan:  Routine general medical examination at a  health care facility -she is a pleasant 18 year old female who is new to the practice and here to establish care and for a CPE.  She has no concerns today.  She is followed by GI for chronic nausea.  She also has an OB/GYN.  Starting her junior year at [employer] and states she is up-to-date on all immunizations including Gardasil.  Denies ever being sexually active and declines STD testing.  States she has had labs drawn for her GI issues and declines lab work today.  Wished her luck in her studies.  Discussed safety and healthy lifestyle with diet and exercise.  She seems to be taking good care of herself.  Seems to be very mature for her age.  I will see her back as needed.  Seasonal allergies - Plan: takes Zyrtec as needed. Has seen an allergist in the past  Chronic nausea - Plan: managed by GI.   Scheduled for a gastric emptying study very soon.  Takes Zofran as needed.  Mild intermittent childhood asthma without complication - Plan: not an issue

## 2019-02-22 NOTE — Patient Instructions (Signed)
Preventive Care 18-21 Years Old, Female Preventive care refers to lifestyle choices and visits with your health care provider that can promote health and wellness. At this stage in your life, you may start seeing a primary care physician instead of a pediatrician. Your health care is now your responsibility. Preventive care for young adults includes:  A yearly physical exam. This is also called an annual wellness visit.  Regular dental and eye exams.  Immunizations.  Screening for certain conditions.  Healthy lifestyle choices, such as diet and exercise. What can I expect for my preventive care visit? Physical exam Your health care provider may check:  Height and weight. These may be used to calculate body mass index (BMI), which is a measurement that tells if you are at a healthy weight.  Heart rate and blood pressure.  Body temperature. Counseling Your health care provider may ask you questions about:  Past medical problems and family medical history.  Alcohol, tobacco, and drug use.  Home and relationship well-being.  Access to firearms.  Emotional well-being.  Diet, exercise, and sleep habits.  Sexual activity and sexual health.  Method of birth control.  Menstrual cycle.  Pregnancy history. What immunizations do I need?  Influenza (flu) vaccine  This is recommended every year. Tetanus, diphtheria, and pertussis (Tdap) vaccine  You may need a Td booster every 10 years. Varicella (chickenpox) vaccine  You may need this vaccine if you have not already been vaccinated. Human papillomavirus (HPV) vaccine  If recommended by your health care provider, you may need three doses over 6 months. Measles, mumps, and rubella (MMR) vaccine  You may need at least one dose of MMR. You may also need a second dose. Meningococcal conjugate (MenACWY) vaccine  One dose is recommended if you are 19-21 years old and a first-year college student living in a residence hall,  or if you have one of several medical conditions. You may also need additional booster doses. Pneumococcal conjugate (PCV13) vaccine  You may need this if you have certain conditions and were not previously vaccinated. Pneumococcal polysaccharide (PPSV23) vaccine  You may need one or two doses if you smoke cigarettes or if you have certain conditions. Hepatitis A vaccine  You may need this if you have certain conditions or if you travel or work in places where you may be exposed to hepatitis A. Hepatitis B vaccine  You may need this if you have certain conditions or if you travel or work in places where you may be exposed to hepatitis B. Haemophilus influenzae type b (Hib) vaccine  You may need this if you have certain risk factors. You may receive vaccines as individual doses or as more than one vaccine together in one shot (combination vaccines). Talk with your health care provider about the risks and benefits of combination vaccines. What tests do I need? Blood tests  Lipid and cholesterol levels. These may be checked every 5 years starting at age 20.  Hepatitis C test.  Hepatitis B test. Screening  Pelvic exam and Pap test. This may be done every 3 years starting at age 21.  Sexually transmitted disease (STD) testing, if you are at risk.  BRCA-related cancer screening. This may be done if you have a family history of breast, ovarian, tubal, or peritoneal cancers. Other tests  Tuberculosis skin test.  Vision and hearing tests.  Skin exam.  Breast exam. Follow these instructions at home: Eating and drinking   Eat a diet that includes fresh fruits and   vegetables, whole grains, lean protein, and low-fat dairy products.  Drink enough fluid to keep your urine pale yellow.  Do not drink alcohol if: ? Your health care provider tells you not to drink. ? You are pregnant, may be pregnant, or are planning to become pregnant. ? You are under the legal drinking age. In the  [place] the legal drinking age is 21.  If you drink alcohol: ? Limit how much you have to 0-1 drink a day. ? Be aware of how much alcohol is in your drink. In the [place] one drink equals one 12 oz bottle of beer (355 mL), one 5 oz glass of wine (148 mL), or one 1 oz glass of hard liquor (44 mL). Lifestyle  Take daily care of your teeth and gums.  Stay active. Exercise at least 30 minutes 5 or more days of the week.  Do not use any products that contain nicotine or tobacco, such as cigarettes, e-cigarettes, and chewing tobacco. If you need help quitting, ask your health care provider.  Do not use drugs.  If you are sexually active, practice safe sex. Use a condom or other form of birth control (contraception) in order to prevent pregnancy and STIs (sexually transmitted infections). If you plan to become pregnant, see your health care provider for a pre-conception visit.  Find healthy ways to cope with stress, such as: ? Meditation, yoga, or listening to music. ? Journaling. ? Talking to a trusted person. ? Spending time with friends and family. Safety  Always wear your seat belt while driving or riding in a vehicle.  Do not drive if you have been drinking alcohol. Do not ride with someone who has been drinking.  Do not drive when you are tired or distracted. Do not text while driving.  Wear a helmet and other protective equipment during sports activities.  If you have firearms in your house, make sure you follow all gun safety procedures.  Seek help if you have been bullied, physically abused, or sexually abused.  Use the Internet responsibly to avoid dangers such as online bullying and online sex predators. What's next?  Go to your health care provider once a year for a well check visit.  Ask your health care provider how often you should have your eyes and teeth checked.  Stay up to date on all vaccines. This information is not intended to replace advice given to you by  your health care provider. Make sure you discuss any questions you have with your health care provider. Document Released: 12/17/2015 Document Revised: 07/26/2018 Document Reviewed: 07/26/2018 Elsevier Patient Education  2020 Elsevier Inc.  

## 2019-03-11 DIAGNOSIS — R194 Change in bowel habit: Secondary | ICD-10-CM | POA: Diagnosis not present

## 2019-03-11 DIAGNOSIS — R110 Nausea: Secondary | ICD-10-CM | POA: Diagnosis not present

## 2019-04-29 DIAGNOSIS — R110 Nausea: Secondary | ICD-10-CM | POA: Diagnosis not present

## 2019-04-29 DIAGNOSIS — R140 Abdominal distension (gaseous): Secondary | ICD-10-CM | POA: Diagnosis not present

## 2019-07-09 DIAGNOSIS — R110 Nausea: Secondary | ICD-10-CM | POA: Diagnosis not present

## 2019-07-09 DIAGNOSIS — R1084 Generalized abdominal pain: Secondary | ICD-10-CM | POA: Diagnosis not present

## 2019-07-31 DIAGNOSIS — R110 Nausea: Secondary | ICD-10-CM | POA: Diagnosis not present

## 2019-08-05 DIAGNOSIS — K911 Postgastric surgery syndromes: Secondary | ICD-10-CM | POA: Diagnosis not present

## 2019-11-06 DIAGNOSIS — R140 Abdominal distension (gaseous): Secondary | ICD-10-CM | POA: Diagnosis not present

## 2019-11-06 DIAGNOSIS — R110 Nausea: Secondary | ICD-10-CM | POA: Diagnosis not present

## 2019-11-06 DIAGNOSIS — K5900 Constipation, unspecified: Secondary | ICD-10-CM | POA: Diagnosis not present

## 2019-11-08 DIAGNOSIS — Z6822 Body mass index (BMI) 22.0-22.9, adult: Secondary | ICD-10-CM | POA: Diagnosis not present

## 2019-11-08 DIAGNOSIS — Z01419 Encounter for gynecological examination (general) (routine) without abnormal findings: Secondary | ICD-10-CM | POA: Diagnosis not present

## 2019-12-19 ENCOUNTER — Other Ambulatory Visit: Payer: Self-pay | Admitting: Gastroenterology

## 2019-12-20 DIAGNOSIS — K5900 Constipation, unspecified: Secondary | ICD-10-CM | POA: Diagnosis not present

## 2019-12-20 DIAGNOSIS — R140 Abdominal distension (gaseous): Secondary | ICD-10-CM | POA: Diagnosis not present

## 2019-12-20 DIAGNOSIS — R112 Nausea with vomiting, unspecified: Secondary | ICD-10-CM | POA: Diagnosis not present

## 2019-12-20 DIAGNOSIS — R110 Nausea: Secondary | ICD-10-CM | POA: Diagnosis not present

## 2020-05-01 IMAGING — CT CT HEAD W/O CM
3 series · 15 of 47 positions shown, 18 images · non-contrast
Comparison: None.

CLINICAL DATA: 17-year-old female with nausea

EXAM:
CT HEAD WITHOUT CONTRAST
TECHNIQUE: Contiguous axial images were obtained from the base of the skull
through the vertex without intravenous contrast.

[Series 3: head 5.0 h30s · axial · 0.43mm/px · z∈[-90,+35]mm · 9 of 31 slices shown, 12 images]
[im 3/31  brain]
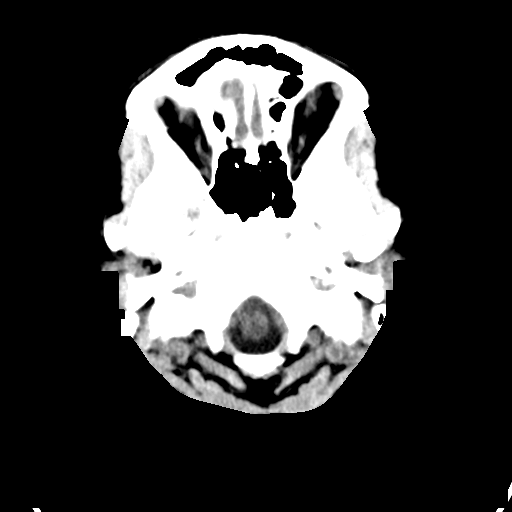
[im 3/31  bone]
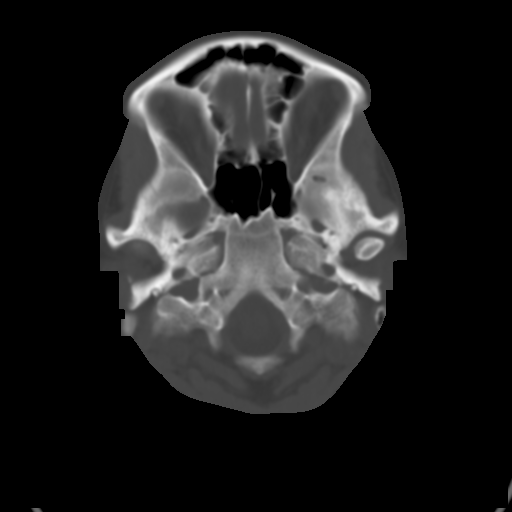
[im 6/31  brain]
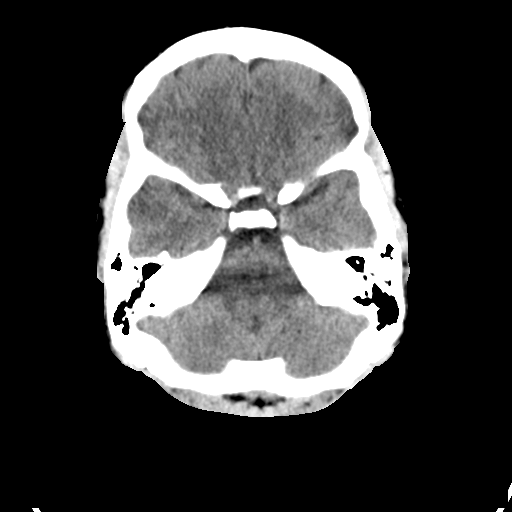
[im 9/31  brain]
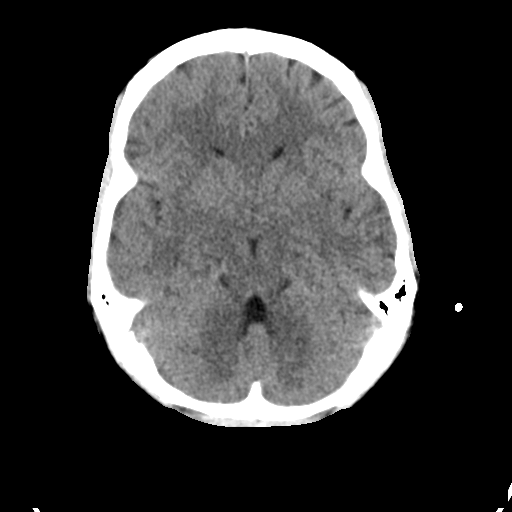
[im 12/31  brain]
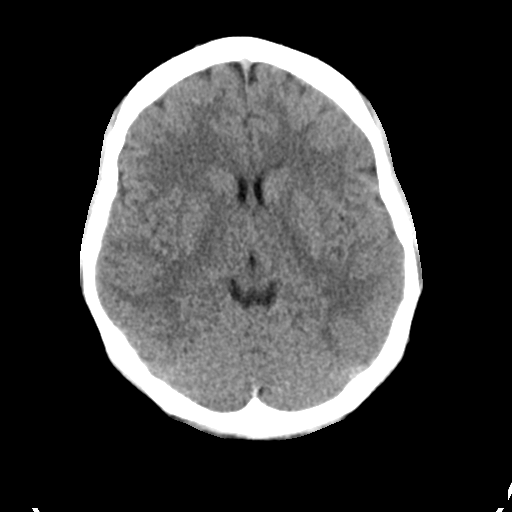
[im 16/31  brain]
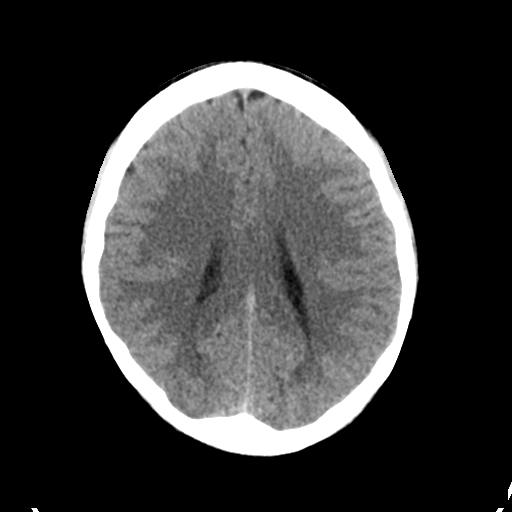
[im 16/31  bone]
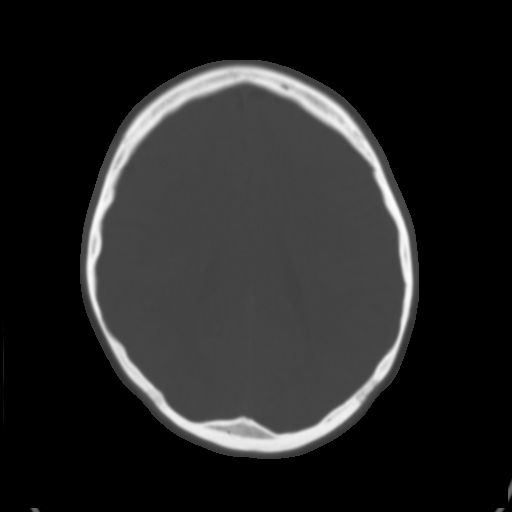
[im 19/31  brain]
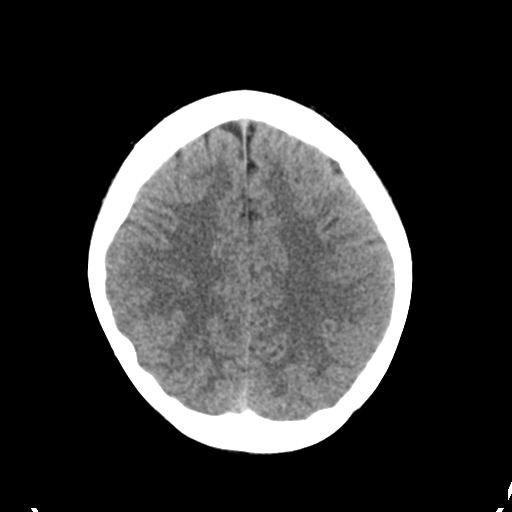
[im 22/31  brain]
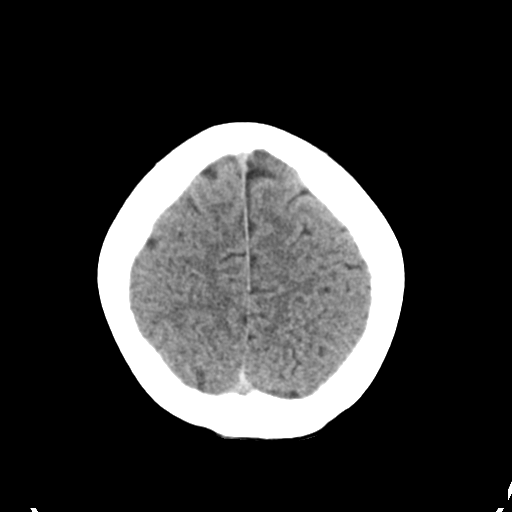
[im 25/31  brain]
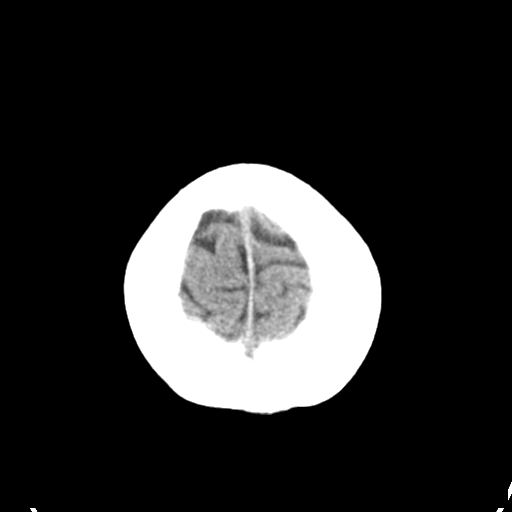
[im 28/31  brain]
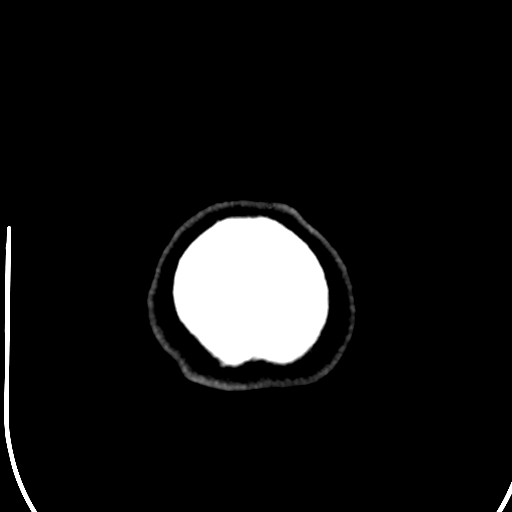
[im 28/31  bone]
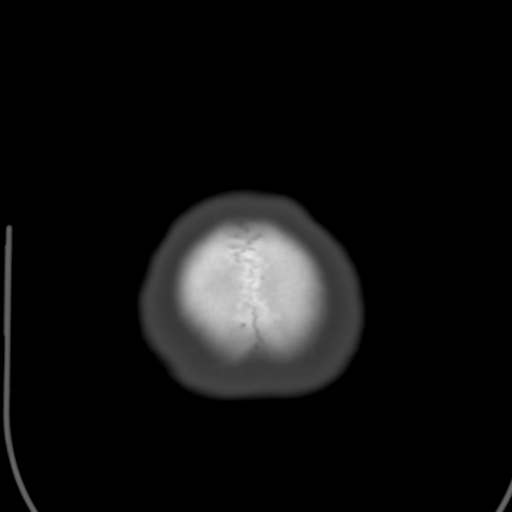

[Series 5: head 3.0 mpr cor · coronal · 0.30mm/px · 3 of 64 slices shown]
[im 22/64  brain]
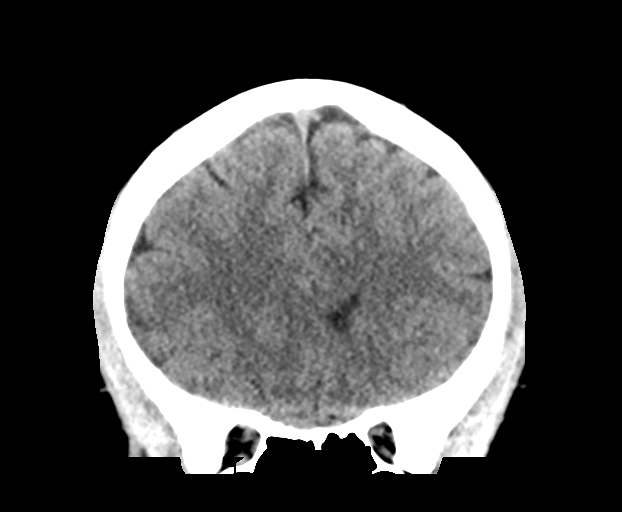
[im 29/64  brain]
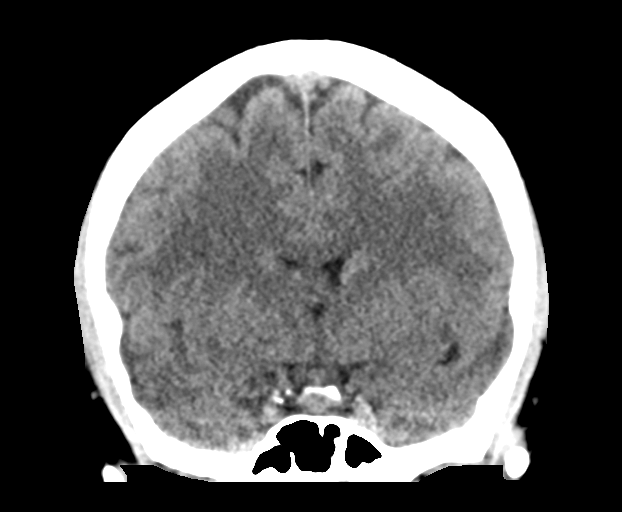
[im 36/64  brain]
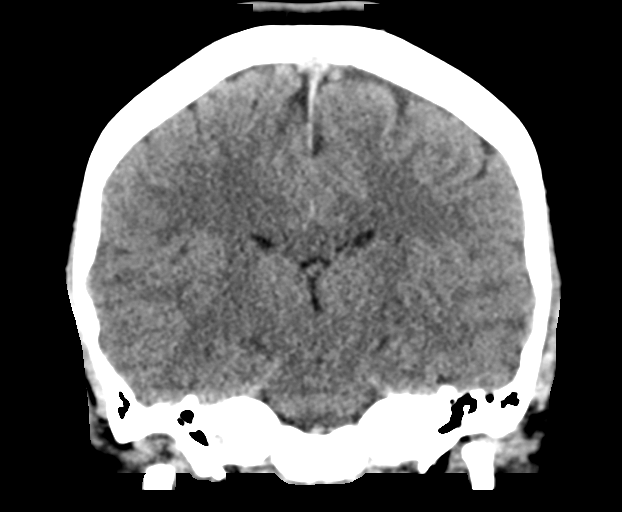

[Series 6: head 3.0 mpr sag · sagittal · 0.36mm/px · 3 of 67 slices shown]
[im 23/67  brain]
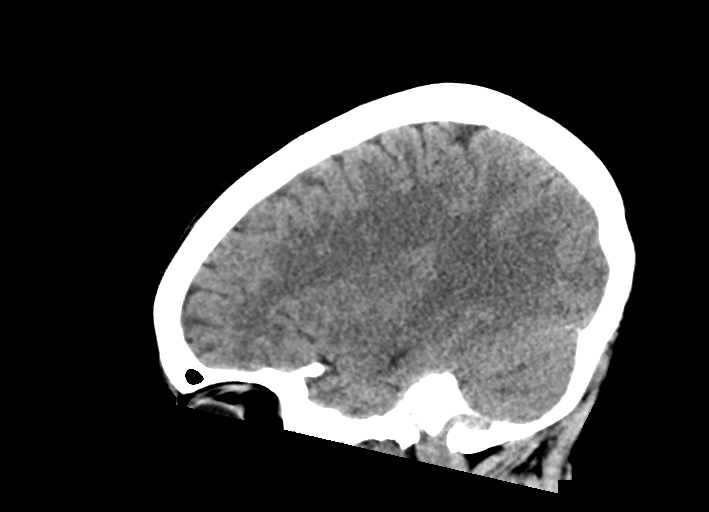
[im 34/67  brain]
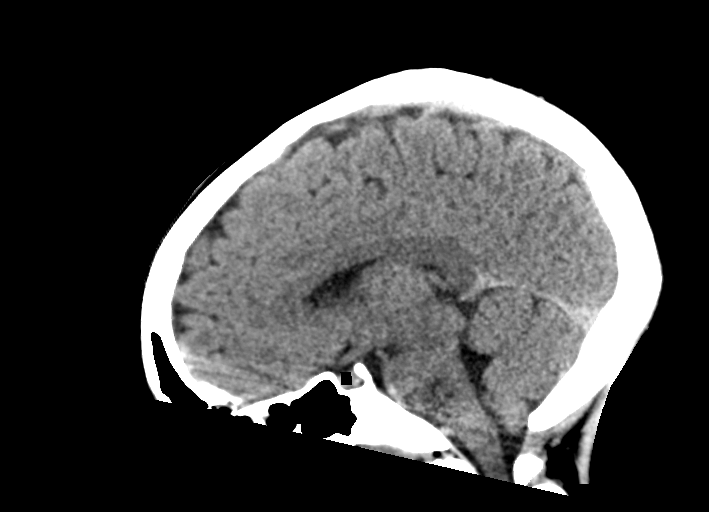
[im 45/67  brain]
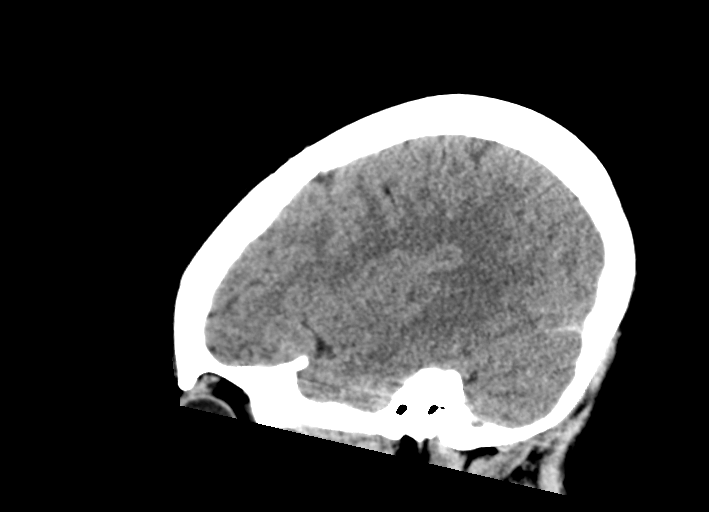

[15 of 47 positions shown; findings below may reference images not displayed]

FINDINGS: Brain: No evidence of acute infarction, hemorrhage, hydrocephalus,
extra-axial collection or mass lesion/mass effect.

Vascular: No hyperdense vessel or unexpected calcification.

Skull: Normal. Negative for fracture or focal lesion.

Sinuses/Orbits: No acute finding.

Other: None.
IMPRESSION: Negative head CT.

## 2020-05-04 ENCOUNTER — Telehealth: Payer: Self-pay | Admitting: Gastroenterology

## 2020-05-04 NOTE — Telephone Encounter (Signed)
Pt is requesting a refill on her zofran.

## 2020-05-04 NOTE — Telephone Encounter (Signed)
Pt needs a follow up for refills, last seen 08-2018

## 2020-07-07 ENCOUNTER — Ambulatory Visit: Payer: BC Managed Care – PPO | Admitting: Gastroenterology
